# Patient Record
Sex: Female | Born: 1995 | Race: Black or African American | Hispanic: No | Marital: Single | Smoking: Never smoker
Health system: Southern US, Community
[De-identification: ages and names within clinical notes are randomized; demographics above are authoritative.]

## PROBLEM LIST (undated history)

## (undated) DIAGNOSIS — O009 Unspecified ectopic pregnancy without intrauterine pregnancy: Secondary | ICD-10-CM

## (undated) HISTORY — PX: SALPINGECTOMY: SHX328

---

## 2016-09-02 ENCOUNTER — Encounter (HOSPITAL_BASED_OUTPATIENT_CLINIC_OR_DEPARTMENT_OTHER): Payer: Self-pay | Admitting: Emergency Medicine

## 2016-09-02 ENCOUNTER — Emergency Department (HOSPITAL_BASED_OUTPATIENT_CLINIC_OR_DEPARTMENT_OTHER)
Admission: EM | Admit: 2016-09-02 | Discharge: 2016-09-02 | Disposition: A | Discharge status: Home / Self Care | Payer: PRIVATE HEALTH INSURANCE | Attending: Emergency Medicine | Admitting: Emergency Medicine

## 2016-09-02 DIAGNOSIS — G44209 Tension-type headache, unspecified, not intractable: Secondary | ICD-10-CM | POA: Insufficient documentation

## 2016-09-02 DIAGNOSIS — J029 Acute pharyngitis, unspecified: Secondary | ICD-10-CM | POA: Diagnosis not present

## 2016-09-02 DIAGNOSIS — R0602 Shortness of breath: Secondary | ICD-10-CM | POA: Diagnosis present

## 2016-09-02 NOTE — ED Provider Notes (Signed)
[facility] Provider Note   CSN: [id number] Arrival date & time: 09/02/16  1059     History   Chief Complaint Chief Complaint  Patient presents with  . Shortness of Breath    HPI Jessica Riggs is a 21 y.o. female.  HPI Patient presents the emergency department complaining of headache over the past several days as well as scratchy and irritated throat.  She also reports that she occasionally has to take a "double breath" to catch her breath.  She has no shortness of breath with exertion.  No orthopnea.  No chest pain.  No weakness of her arms or legs.  No recent head injury.  No weight loss or change in her vision.  Patient's been eating and drinking normally.  No other complaints   No past medical history on file.  There are no active problems to display for this patient.   No past surgical history on file.  OB History    No data available       Home Medications    Prior to Admission medications   Not on File    Family History No family history on file.  Social History Social History  Substance Use Topics  . Smoking status: Never Smoker  . Smokeless tobacco: Never Used  . Alcohol use No     Allergies   Patient has no known allergies.   Review of Systems Review of Systems  All other systems reviewed and are negative.    Physical Exam Updated Vital Signs BP (!) 130/99   Pulse 78   Temp 98.7 F (37.1 C) (Oral)   Resp 18   Ht 5' 7.5" (1.715 m)   Wt 99.8 kg (220 lb)   LMP 05/03/2016   SpO2 100%   BMI 33.95 kg/m   Physical Exam  Constitutional: She is oriented to person, place, and time. She appears well-developed and well-nourished. No distress.  HENT:  Head: Normocephalic and atraumatic.  Eyes: EOM are normal.  Neck: Normal range of motion.  Cardiovascular: Normal rate, regular rhythm and normal heart sounds.   Pulmonary/Chest: Effort normal and breath sounds normal.  Abdominal: Soft. She exhibits no distension. There is no  tenderness.  Musculoskeletal: Normal range of motion.  Neurological: She is alert and oriented to person, place, and time.  Skin: Skin is warm and dry.  Psychiatric: She has a normal mood and affect. Judgment normal.  Nursing note and vitals reviewed.    ED Treatments / Results  Labs (all labs ordered are listed, but only abnormal results are displayed) Labs Reviewed - No data to display  EKG  EKG Interpretation None       Radiology No results found.  Procedures Procedures (including critical care time)  Medications Ordered in ED Medications - No data to display   Initial Impression / Assessment and Plan / ED Course  I have reviewed the triage vital signs and the nursing notes.  Pertinent labs & imaging results that were available during my care of the patient were reviewed by me and considered in my medical decision making (see chart for details).     Well-appearing.  Increased stressors at home.  Mild generalized headache.  No hypoxia.  No increased work of breathing when ambulating.  Discharge home in good condition.  Primary care follow-up.  No indication for treatment or intervention or additional testing while in the ER.  Final Clinical Impressions(s) / ED Diagnoses   Final diagnoses:  Acute non intractable tension-type  headache  Sore throat    New Prescriptions New Prescriptions   No medications on file     Azalia Bilis, MD 09/02/16 1143

## 2016-09-02 NOTE — ED Triage Notes (Signed)
Pt states she has been sob for a few day, pt a/o in triage talking full sentences, NAD noted. Throat hurts after swallowing a pill wrong

## 2016-09-02 NOTE — Progress Notes (Signed)
RT assessed pt.  No distress noted, sats 100% on RA.  No resp. Hx per pt.  RN notified.  Will continue to monitor.

## 2016-09-02 NOTE — ED Notes (Signed)
Pt left before receiving d/c instructions 

## 2019-02-23 ENCOUNTER — Emergency Department (HOSPITAL_BASED_OUTPATIENT_CLINIC_OR_DEPARTMENT_OTHER)
Admission: EM | Admit: 2019-02-23 | Discharge: 2019-02-23 | Disposition: A | Discharge status: Home / Self Care | Payer: PRIVATE HEALTH INSURANCE | Attending: Emergency Medicine | Admitting: Emergency Medicine

## 2019-02-23 ENCOUNTER — Encounter (HOSPITAL_BASED_OUTPATIENT_CLINIC_OR_DEPARTMENT_OTHER): Payer: Self-pay | Admitting: Emergency Medicine

## 2019-02-23 ENCOUNTER — Other Ambulatory Visit: Payer: Self-pay

## 2019-02-23 DIAGNOSIS — J029 Acute pharyngitis, unspecified: Secondary | ICD-10-CM

## 2019-02-23 LAB — GROUP A STREP BY PCR: Group A Strep by PCR: NOT DETECTED

## 2019-02-23 MED ORDER — PREDNISONE 50 MG PO TABS
60.0000 mg | ORAL_TABLET | Freq: Once | ORAL | Status: AC
  Administered 2019-02-23: 60 mg via ORAL
  Filled 2019-02-23: qty 1

## 2019-02-23 MED ORDER — HYDROCODONE-ACETAMINOPHEN 5-325 MG PO TABS: 1.0000 | ORAL_TABLET | Freq: Four times a day (QID) | ORAL | 0 refills | Status: AC | PRN

## 2019-02-23 MED ORDER — PREDNISONE 10 MG PO TABS: 40.0000 mg | ORAL_TABLET | Freq: Every day | ORAL | 0 refills | Status: DC

## 2019-02-23 NOTE — Discharge Instructions (Addendum)
Take the prednisone as directed starting tomorrow.  Get your first dose today.  Take pain medicine as needed.  In the meantime can take Tylenol for the pain but when she start taking the pain medicine do not take Tylenol with it.  Return for any new or worse symptoms.  Your rapid strep test today was negative for strep pharyngitis.  So this is most likely a viral sore throat.  As we discussed if it persist beyond 7 days as possible could be from mononucleosis which is a virus and after a week we cannot test you for that to see if it is positive.

## 2019-02-23 NOTE — ED Triage Notes (Signed)
"   I have been having trouble when I swallow x 2 days" Drinking po well, no n/v or fever

## 2019-02-23 NOTE — ED Provider Notes (Signed)
[facility] EMERGENCY DEPARTMENT Provider Note   CSN: [id number] Arrival date & time: 02/23/19  2595     History Chief Complaint  Patient presents with  . Sore Throat    Jessica Riggs is a 23 y.o. female.  Patient with sore throat pain for 2 days and trouble swallowing.  No nausea vomiting or fever no cough or congestion.  No sick exposures.  Patient has not had a history of strep pharyngitis in the past.        History reviewed. No pertinent past medical history.  There are no problems to display for this patient.   History reviewed. No pertinent surgical history.   OB History   No obstetric history on file.     No family history on file.  Social History   Tobacco Use  . Smoking status: Never Smoker  . Smokeless tobacco: Never Used  Substance Use Topics  . Alcohol use: No  . Drug use: No    Home Medications Prior to Admission medications   Medication Sig Start Date End Date Taking? Authorizing Provider  JUNEL FE 1/20 1-20 MG-MCG tablet Take 1 tablet by mouth daily. 12/30/18   [provider]    Allergies    Patient has no known allergies.  Review of Systems   Review of Systems  Constitutional: Negative for chills and fever.  HENT: Positive for sore throat and trouble swallowing. Negative for congestion, facial swelling, rhinorrhea and sinus pain.   Eyes: Negative for visual disturbance.  Respiratory: Negative for cough and shortness of breath.   Cardiovascular: Negative for chest pain and leg swelling.  Gastrointestinal: Negative for abdominal pain, diarrhea, nausea and vomiting.  Genitourinary: Negative for dysuria.  Musculoskeletal: Negative for back pain and neck pain.  Skin: Negative for rash.  Neurological: Negative for dizziness, light-headedness and headaches.  Hematological: Does not bruise/bleed easily.  Psychiatric/Behavioral: Negative for confusion.    Physical Exam Updated Vital Signs BP (!) 137/106 (BP Location:  Right Arm)   Pulse (!) 102   Temp 99.2 F (37.3 C) (Oral)   Resp 14   Ht 1.727 m (5\' 8" )   Wt 68 kg   LMP 02/08/2019   SpO2 99%   BMI 22.81 kg/m   Physical Exam Vitals and nursing note reviewed.  Constitutional:      General: She is not in acute distress.    Appearance: Normal appearance. She is well-developed.  HENT:     Head: Normocephalic and atraumatic.     Mouth/Throat:     Mouth: Mucous membranes are moist.     Pharynx: Oropharyngeal exudate and posterior oropharyngeal erythema present.     Comments: Exudate patches on both tonsils.  Uvula midline.  Enlargement of tonsils erythema. Eyes:     Extraocular Movements: Extraocular movements intact.     Conjunctiva/sclera: Conjunctivae normal.     Pupils: Pupils are equal, round, and reactive to light.  Cardiovascular:     Rate and Rhythm: Normal rate and regular rhythm.     Heart sounds: No murmur.  Pulmonary:     Effort: Pulmonary effort is normal. No respiratory distress.     Breath sounds: Normal breath sounds.  Abdominal:     Palpations: Abdomen is soft.     Tenderness: There is no abdominal tenderness.  Musculoskeletal:        General: No swelling. Normal range of motion.     Cervical back: Normal range of motion and neck supple. No rigidity.  Lymphadenopathy:  Cervical: No cervical adenopathy.  Skin:    General: Skin is warm and dry.  Neurological:     General: No focal deficit present.     Mental Status: She is alert and oriented to person, place, and time.     ED Results / Procedures / Treatments   Labs (all labs ordered are listed, but only abnormal results are displayed) Labs Reviewed  GROUP A STREP BY PCR    EKG None  Radiology No results found.  Procedures Procedures (including critical care time)  Medications Ordered in ED Medications - No data to display  ED Course  I have reviewed the triage vital signs and the nursing notes.  Pertinent labs & imaging results that were  available during my care of the patient were reviewed by me and considered in my medical decision making (see chart for details).    MDM Rules/Calculators/A&P                      Consistent with pharyngitis.  No other symptoms.  Will check rapid strep.  If positive will treat with antibiotics if negative will treat symptomatically with steroids and some pain control.  Patient nontoxic no acute distress   Final Clinical Impression(s) / ED Diagnoses Final diagnoses:  Acute pharyngitis, unspecified etiology    Rx / DC Orders ED Discharge Orders    None       Fredia Sorrow, MD 02/23/19 [id number]

## 2019-02-23 NOTE — ED Notes (Signed)
Given  popsicle

## 2019-10-10 ENCOUNTER — Encounter (HOSPITAL_BASED_OUTPATIENT_CLINIC_OR_DEPARTMENT_OTHER): Payer: Self-pay

## 2019-10-10 ENCOUNTER — Emergency Department (HOSPITAL_BASED_OUTPATIENT_CLINIC_OR_DEPARTMENT_OTHER): Payer: PRIVATE HEALTH INSURANCE

## 2019-10-10 ENCOUNTER — Other Ambulatory Visit: Payer: Self-pay

## 2019-10-10 ENCOUNTER — Emergency Department (HOSPITAL_BASED_OUTPATIENT_CLINIC_OR_DEPARTMENT_OTHER)
Admission: EM | Admit: 2019-10-10 | Discharge: 2019-10-10 | Disposition: A | Discharge status: Home / Self Care | Payer: PRIVATE HEALTH INSURANCE | Attending: Emergency Medicine | Admitting: Emergency Medicine

## 2019-10-10 DIAGNOSIS — R05 Cough: Secondary | ICD-10-CM | POA: Diagnosis present

## 2019-10-10 DIAGNOSIS — Z20822 Contact with and (suspected) exposure to covid-19: Secondary | ICD-10-CM | POA: Insufficient documentation

## 2019-10-10 DIAGNOSIS — J069 Acute upper respiratory infection, unspecified: Secondary | ICD-10-CM | POA: Diagnosis not present

## 2019-10-10 LAB — SARS CORONAVIRUS 2 BY RT PCR (HOSPITAL ORDER, PERFORMED IN ~~LOC~~ HOSPITAL LAB): SARS Coronavirus 2: NEGATIVE

## 2019-10-10 LAB — URINALYSIS, MICROSCOPIC (REFLEX)

## 2019-10-10 LAB — URINALYSIS, ROUTINE W REFLEX MICROSCOPIC
Bilirubin Urine: NEGATIVE
Glucose, UA: NEGATIVE mg/dL
Ketones, ur: NEGATIVE mg/dL
Leukocytes,Ua: NEGATIVE
Nitrite: NEGATIVE
Protein, ur: NEGATIVE mg/dL
Specific Gravity, Urine: 1.025 (ref 1.005–1.030)
pH: 6 (ref 5.0–8.0)

## 2019-10-10 LAB — PREGNANCY, URINE: Preg Test, Ur: NEGATIVE

## 2019-10-10 MED ORDER — AEROCHAMBER PLUS FLO-VU MEDIUM MISC
1.0000 | Freq: Once | Status: AC
  Administered 2019-10-10: 1
  Filled 2019-10-10: qty 1

## 2019-10-10 MED ORDER — PREDNISONE 10 MG (21) PO TBPK: ORAL_TABLET | ORAL | 0 refills | Status: AC

## 2019-10-10 MED ORDER — IBUPROFEN 600 MG PO TABS: 600.0000 mg | ORAL_TABLET | Freq: Four times a day (QID) | ORAL | 0 refills | Status: AC | PRN

## 2019-10-10 MED ORDER — ALBUTEROL SULFATE HFA 108 (90 BASE) MCG/ACT IN AERS
2.0000 | INHALATION_SPRAY | Freq: Once | RESPIRATORY_TRACT | Status: AC
  Administered 2019-10-10: 2 via RESPIRATORY_TRACT
  Filled 2019-10-10: qty 6.7

## 2019-10-10 NOTE — ED Triage Notes (Signed)
Pt c/o flu like sx x 4 days-NAD-steady gait 

## 2019-10-10 NOTE — ED Provider Notes (Signed)
[facility] EMERGENCY DEPARTMENT Provider Note   CSN: [id number] Arrival date & time: 10/10/19  1158     History Chief Complaint  Patient presents with  . Cough    Jessica Riggs is a 24 y.o. female.  Pt presents to the ED today with flu like symptoms for the past few days.  Pt has really not been feeling well for about 3 weeks.  She has not been vaccinated against Covid.  She was tested for Covid last about 3 weeks ago when sx started. No f/c.        History reviewed. No pertinent past medical history.  There are no problems to display for this patient.   History reviewed. No pertinent surgical history.   OB History   No obstetric history on file.     No family history on file.  Social History   Tobacco Use  . Smoking status: Never Smoker  . Smokeless tobacco: Never Used  Vaping Use  . Vaping Use: Never used  Substance Use Topics  . Alcohol use: No  . Drug use: No    Home Medications Prior to Admission medications   Medication Sig Start Date End Date Taking? Authorizing Provider  HYDROcodone-acetaminophen (NORCO/VICODIN) 5-325 MG tablet Take 1 tablet by mouth every 6 (six) hours as needed for moderate pain. 02/23/19   Vanetta Mulders, MD  ibuprofen (ADVIL) 600 MG tablet Take 1 tablet (600 mg total) by mouth every 6 (six) hours as needed. 10/10/19   Jacalyn Lefevre, MD  JUNEL FE 1/20 1-20 MG-MCG tablet Take 1 tablet by mouth daily. 12/30/18   [provider]  predniSONE (STERAPRED UNI-PAK 21 TAB) 10 MG (21) TBPK tablet Take 6 tabs for 2 days, then 5 for 2 days, then 4 for 2 days, then 3 for 2 days, 2 for 2 days, then 1 for 2 days 10/10/19   Jacalyn Lefevre, MD    Allergies    Patient has no known allergies.  Review of Systems   Review of Systems  Respiratory: Positive for cough and shortness of breath.   All other systems reviewed and are negative.   Physical Exam Updated Vital Signs BP (!) 139/92 (BP Location: Right Arm)   Pulse 90    Temp 98.7 F (37.1 C) (Oral)   Resp 18   Ht 5\' 8"  (1.727 m)   Wt 117.9 kg   SpO2 100%   BMI 39.53 kg/m   Physical Exam Vitals and nursing note reviewed.  Constitutional:      Appearance: Normal appearance.  HENT:     Head: Normocephalic and atraumatic.     Right Ear: External ear normal.     Left Ear: External ear normal.     Nose: Nose normal.     Mouth/Throat:     Mouth: Mucous membranes are moist.     Pharynx: Oropharynx is clear.  Eyes:     Extraocular Movements: Extraocular movements intact.     Conjunctiva/sclera: Conjunctivae normal.     Pupils: Pupils are equal, round, and reactive to light.  Cardiovascular:     Rate and Rhythm: Normal rate and regular rhythm.     Pulses: Normal pulses.     Heart sounds: Normal heart sounds.  Pulmonary:     Effort: Pulmonary effort is normal.     Breath sounds: Normal breath sounds.  Abdominal:     General: Abdomen is flat. Bowel sounds are normal.     Palpations: Abdomen is soft.  Musculoskeletal:  General: Normal range of motion.     Cervical back: Normal range of motion and neck supple.  Skin:    General: Skin is warm.     Capillary Refill: Capillary refill takes less than 2 seconds.  Neurological:     General: No focal deficit present.     Mental Status: She is alert and oriented to person, place, and time.  Psychiatric:        Mood and Affect: Mood normal.        Behavior: Behavior normal.        Thought Content: Thought content normal.        Judgment: Judgment normal.     ED Results / Procedures / Treatments   Labs (all labs ordered are listed, but only abnormal results are displayed) Labs Reviewed  URINALYSIS, ROUTINE W REFLEX MICROSCOPIC - Abnormal; Notable for the following components:      Result Value   Hgb urine dipstick TRACE (*)    All other components within normal limits  URINALYSIS, MICROSCOPIC (REFLEX) - Abnormal; Notable for the following components:   Bacteria, UA MANY (*)    All  other components within normal limits  SARS CORONAVIRUS 2 BY RT PCR (HOSPITAL ORDER, PERFORMED IN [facility])  PREGNANCY, URINE    EKG None  Radiology DG Chest Portable 1 View  Result Date: 10/10/2019 CLINICAL DATA:  Chest pain with cough and shortness of breath EXAM: PORTABLE CHEST 1 VIEW COMPARISON:  January 27, 2018 FINDINGS: Lungs are clear. Heart size and pulmonary vascularity are normal. No adenopathy. No pneumothorax. No bone lesions. IMPRESSION: No abnormality noted. Electronically Signed   By: Bretta Bang III M.D.   On: 10/10/2019 13:38    Procedures Procedures (including critical care time)  Medications Ordered in ED Medications  albuterol (VENTOLIN HFA) 108 (90 Base) MCG/ACT inhaler 2 puff (2 puffs Inhalation Given 10/10/19 1355)  AeroChamber Plus Flo-Vu Medium MISC 1 each (1 each Other Given 10/10/19 1356)    ED Course  I have reviewed the triage vital signs and the nursing notes.  Pertinent labs & imaging results that were available during my care of the patient were reviewed by me and considered in my medical decision making (see chart for details).    MDM Rules/Calculators/A&P                         Pt's Covid swab was negative.  CXR is clear.  Oxygen is normal.  Pt is given an albuterol inhaler and instructed to use it for cough.  She will be d/c with prednisone and ibuprofen.  Pt is stable for d/c.   Jessica Riggs was evaluated in Emergency Department on 10/10/2019 for the symptoms described in the history of present illness. She was evaluated in the context of the global COVID-19 pandemic, which necessitated consideration that the patient might be at risk for infection with the SARS-CoV-2 virus that causes COVID-19. Institutional protocols and algorithms that pertain to the evaluation of patients at risk for COVID-19 are in a state of rapid change based on information released by regulatory bodies including the CDC and federal and state  organizations. These policies and algorithms were followed during the patient's care in the ED. Final Clinical Impression(s) / ED Diagnoses Final diagnoses:  Viral URI with cough  COVID-19 virus not detected    Rx / DC Orders ED Discharge Orders         Ordered  ibuprofen (ADVIL) 600 MG tablet  Every 6 hours PRN     Discontinue  Reprint     10/10/19 1409    predniSONE (STERAPRED UNI-PAK 21 TAB) 10 MG (21) TBPK tablet     Discontinue  Reprint     10/10/19 1409           Jacalyn Lefevre, MD 10/10/19 1411

## 2019-10-10 NOTE — ED Notes (Signed)
Pulse ox while walking to room; 98-100%.  No acute respiratory distress.  Pt states she only feels like she has a hard time breathing when laying flat.

## 2019-11-14 ENCOUNTER — Encounter (HOSPITAL_BASED_OUTPATIENT_CLINIC_OR_DEPARTMENT_OTHER): Payer: Self-pay | Admitting: Emergency Medicine

## 2019-11-14 ENCOUNTER — Emergency Department (HOSPITAL_BASED_OUTPATIENT_CLINIC_OR_DEPARTMENT_OTHER): Payer: PRIVATE HEALTH INSURANCE

## 2019-11-14 ENCOUNTER — Other Ambulatory Visit: Payer: Self-pay

## 2019-11-14 ENCOUNTER — Emergency Department (HOSPITAL_BASED_OUTPATIENT_CLINIC_OR_DEPARTMENT_OTHER)
Admission: EM | Admit: 2019-11-14 | Discharge: 2019-11-14 | Disposition: A | Discharge status: Home / Self Care | Payer: PRIVATE HEALTH INSURANCE | Attending: Emergency Medicine | Admitting: Emergency Medicine

## 2019-11-14 DIAGNOSIS — R059 Cough, unspecified: Secondary | ICD-10-CM

## 2019-11-14 DIAGNOSIS — R05 Cough: Secondary | ICD-10-CM | POA: Insufficient documentation

## 2019-11-14 DIAGNOSIS — Z20822 Contact with and (suspected) exposure to covid-19: Secondary | ICD-10-CM | POA: Diagnosis not present

## 2019-11-14 DIAGNOSIS — Z79899 Other long term (current) drug therapy: Secondary | ICD-10-CM | POA: Insufficient documentation

## 2019-11-14 DIAGNOSIS — J40 Bronchitis, not specified as acute or chronic: Secondary | ICD-10-CM

## 2019-11-14 LAB — SARS CORONAVIRUS 2 BY RT PCR (HOSPITAL ORDER, PERFORMED IN ~~LOC~~ HOSPITAL LAB): SARS Coronavirus 2: NEGATIVE

## 2019-11-14 MED ORDER — GUAIFENESIN 100 MG/5ML PO SOLN
10.0000 mL | Freq: Once | ORAL | Status: AC
  Administered 2019-11-14: 200 mg via ORAL
  Filled 2019-11-14: qty 10

## 2019-11-14 MED ORDER — AZITHROMYCIN 250 MG PO TABS
500.0000 mg | ORAL_TABLET | Freq: Once | ORAL | Status: AC
  Administered 2019-11-14: 500 mg via ORAL
  Filled 2019-11-14: qty 2

## 2019-11-14 MED ORDER — AZITHROMYCIN 250 MG PO TABS: 250.0000 mg | ORAL_TABLET | Freq: Every day | ORAL | 0 refills | Status: AC

## 2019-11-14 NOTE — ED Triage Notes (Signed)
Pt here with cough x 1 week. Has Rx for tessalon pearls with no relief. OTC does not work. Body aches. Was tested for COVID and it was negative here.

## 2019-11-14 NOTE — Discharge Instructions (Addendum)
It was our pleasure to provide your ER care today - we hope that you feel better.  Take antibiotic as prescribed.   Try mucinex or robitussin as need for cough.   Follow up with primary care doctor in 1-2 weeks if symptoms fail to improve/resolve.  For your health and safety, and that of your family and friends, please get covid vaccine as soon as able.   Return to ER if worse, new symptoms, increased trouble breathing, or other concern.

## 2019-11-14 NOTE — ED Provider Notes (Signed)
[facility] EMERGENCY DEPARTMENT Provider Note   CSN: [id number] Arrival date & time: 11/14/19  1824     History Chief Complaint  Patient presents with  . Cough  . Generalized Body Aches    Jessica Riggs is a 24 y.o. female.  Patient c/o productive cough in the past several days, with occasional clear to yellow phlegm. Symptoms acute onset, moderate, persistent. Denies chest pain or discomfort. No sob. No sore throat or runny nose. No fever. No known covid exposure, and is not vaccinated. States in ED prior, given prednisone, but cough persists. Denies hx  Asthma. Non smoker.   The history is provided by the patient.  Cough Associated symptoms: no chest pain, no chills, no fever, no headaches, no rash, no shortness of breath and no sore throat        History reviewed. No pertinent past medical history.  There are no problems to display for this patient.   History reviewed. No pertinent surgical history.   OB History   No obstetric history on file.     History reviewed. No pertinent family history.  Social History   Tobacco Use  . Smoking status: Never Smoker  . Smokeless tobacco: Never Used  Vaping Use  . Vaping Use: Never used  Substance Use Topics  . Alcohol use: No  . Drug use: No    Home Medications Prior to Admission medications   Medication Sig Start Date End Date Taking? Authorizing Provider  HYDROcodone-acetaminophen (NORCO/VICODIN) 5-325 MG tablet Take 1 tablet by mouth every 6 (six) hours as needed for moderate pain. 02/23/19   Vanetta Mulders, MD  ibuprofen (ADVIL) 600 MG tablet Take 1 tablet (600 mg total) by mouth every 6 (six) hours as needed. 10/10/19   Jacalyn Lefevre, MD  JUNEL FE 1/20 1-20 MG-MCG tablet Take 1 tablet by mouth daily. 12/30/18   [provider]  predniSONE (STERAPRED UNI-PAK 21 TAB) 10 MG (21) TBPK tablet Take 6 tabs for 2 days, then 5 for 2 days, then 4 for 2 days, then 3 for 2 days, 2 for 2 days, then 1 for  2 days 10/10/19   Jacalyn Lefevre, MD    Allergies    Patient has no known allergies.  Review of Systems   Review of Systems  Constitutional: Negative for chills and fever.  HENT: Negative for sore throat.   Eyes: Negative for redness.  Respiratory: Positive for cough. Negative for shortness of breath.   Cardiovascular: Negative for chest pain and leg swelling.  Gastrointestinal: Negative for abdominal pain and vomiting.  Genitourinary: Negative for flank pain.  Musculoskeletal: Negative for back pain and neck pain.  Skin: Negative for rash.  Neurological: Negative for headaches.  Hematological: Does not bruise/bleed easily.  Psychiatric/Behavioral: Negative for confusion.    Physical Exam Updated Vital Signs BP (!) 147/108   Pulse 86   Temp 98.9 F (37.2 C) (Oral)   Resp 18   SpO2 99%   Physical Exam Vitals and nursing note reviewed.  Constitutional:      Appearance: Normal appearance. She is well-developed.  HENT:     Head: Atraumatic.     Nose: Nose normal. No congestion or rhinorrhea.     Mouth/Throat:     Mouth: Mucous membranes are moist.     Pharynx: No oropharyngeal exudate or posterior oropharyngeal erythema.  Eyes:     General: No scleral icterus.    Conjunctiva/sclera: Conjunctivae normal.  Neck:     Trachea: No tracheal deviation.  Cardiovascular:     Rate and Rhythm: Normal rate and regular rhythm.     Pulses: Normal pulses.     Heart sounds: Normal heart sounds. No murmur heard.  No friction rub. No gallop.   Pulmonary:     Effort: Pulmonary effort is normal. No respiratory distress.     Breath sounds: Normal breath sounds.     Comments: Coughing, upper resp congestion.  Abdominal:     General: Bowel sounds are normal. There is no distension.     Palpations: Abdomen is soft.     Tenderness: There is no abdominal tenderness. There is no guarding.  Genitourinary:    Comments: No cva tenderness.  Musculoskeletal:        General: No swelling or  tenderness.     Cervical back: Normal range of motion and neck supple. No rigidity. No muscular tenderness.     Right lower leg: No edema.     Left lower leg: No edema.  Skin:    General: Skin is warm and dry.     Findings: No rash.  Neurological:     Mental Status: She is alert.     Comments: Alert, speech normal.   Psychiatric:        Mood and Affect: Mood normal.     ED Results / Procedures / Treatments   Labs (all labs ordered are listed, but only abnormal results are displayed) Labs Reviewed  SARS CORONAVIRUS 2 BY RT PCR (HOSPITAL ORDER, PERFORMED IN [facility])    EKG None  Radiology DG Chest 2 View  Result Date: 11/14/2019 CLINICAL DATA:  Cough, COVID negative EXAM: CHEST - 2 VIEW COMPARISON:  Chest x-ray 10/10/2019 FINDINGS: The heart size and mediastinal contours are within normal limits. No focal consolidation. No pulmonary edema. No pleural effusion. No pneumothorax. No acute osseous abnormality. IMPRESSION: No active cardiopulmonary disease. Electronically Signed   By: Tish Frederickson M.D.   On: 11/14/2019 21:02    Procedures Procedures (including critical care time)  Medications Ordered in ED Medications  azithromycin (ZITHROMAX) tablet 500 mg (500 mg Oral Given 11/14/19 2107)  guaiFENesin (ROBITUSSIN) 100 MG/5ML solution 200 mg (200 mg Oral Given 11/14/19 2108)    ED Course  I have reviewed the triage vital signs and the nursing notes.  Pertinent labs & imaging results that were available during my care of the patient were reviewed by me and considered in my medical decision making (see chart for details).    MDM Rules/Calculators/A&P                          Labs and cxr.   Reviewed nursing notes and prior charts for additional history.   Covid swab sent - pending.  Jessica Riggs was evaluated in Emergency Department on 11/14/2019 for the symptoms described in the history of present illness. She was evaluated in the context of the global  COVID-19 pandemic, which necessitated consideration that the patient might be at risk for infection with the SARS-CoV-2 virus that causes COVID-19. Institutional protocols and algorithms that pertain to the evaluation of patients at risk for COVID-19 are in a state of rapid change based on information released by regulatory bodies including the CDC and federal and state organizations. These policies and algorithms were followed during the patient's care in the ED.  Labs reviewed/interpreted by me - covid negative.   CXR reviewed/interpreted by me - no pna.   Pt  Is breathing  comfortably, and appears stable for d/c.      Final Clinical Impression(s) / ED Diagnoses Final diagnoses:  None    Rx / DC Orders ED Discharge Orders    None       Cathren Laine, MD 11/15/19 1707

## 2020-03-07 ENCOUNTER — Encounter (HOSPITAL_BASED_OUTPATIENT_CLINIC_OR_DEPARTMENT_OTHER): Payer: Self-pay | Admitting: *Deleted

## 2020-03-07 ENCOUNTER — Other Ambulatory Visit: Payer: Self-pay

## 2020-03-07 ENCOUNTER — Emergency Department (HOSPITAL_BASED_OUTPATIENT_CLINIC_OR_DEPARTMENT_OTHER)
Admission: EM | Admit: 2020-03-07 | Discharge: 2020-03-07 | Disposition: A | Discharge status: Home / Self Care | Payer: PRIVATE HEALTH INSURANCE | Attending: Emergency Medicine | Admitting: Emergency Medicine

## 2020-03-07 ENCOUNTER — Emergency Department (HOSPITAL_BASED_OUTPATIENT_CLINIC_OR_DEPARTMENT_OTHER): Payer: PRIVATE HEALTH INSURANCE

## 2020-03-07 DIAGNOSIS — R059 Cough, unspecified: Secondary | ICD-10-CM | POA: Diagnosis present

## 2020-03-07 DIAGNOSIS — U071 COVID-19: Secondary | ICD-10-CM | POA: Insufficient documentation

## 2020-03-07 DIAGNOSIS — R000 Tachycardia, unspecified: Secondary | ICD-10-CM | POA: Diagnosis not present

## 2020-03-07 LAB — COMPREHENSIVE METABOLIC PANEL
ALT: 21 U/L (ref 0–44)
AST: 29 U/L (ref 15–41)
Albumin: 4 g/dL (ref 3.5–5.0)
Alkaline Phosphatase: 43 U/L (ref 38–126)
Anion gap: 13 (ref 5–15)
BUN: 9 mg/dL (ref 6–20)
CO2: 24 mmol/L (ref 22–32)
Calcium: 8.9 mg/dL (ref 8.9–10.3)
Chloride: 98 mmol/L (ref 98–111)
Creatinine, Ser: 0.74 mg/dL (ref 0.44–1.00)
GFR, Estimated: >60 mL/min (ref >60)
Glucose, Bld: 107 mg/dL — ABNORMAL HIGH (ref 70–99)
Potassium: 3.4 mmol/L — ABNORMAL LOW (ref 3.5–5.1)
Sodium: 135 mmol/L (ref 135–145)
Total Bilirubin: 0.5 mg/dL (ref 0.3–1.2)
Total Protein: 7.8 g/dL (ref 6.5–8.1)

## 2020-03-07 LAB — CBC WITH DIFFERENTIAL/PLATELET
Abs Immature Granulocytes: 0.06 10*3/uL (ref 0.00–0.07)
Basophils Absolute: 0 10*3/uL (ref 0.0–0.1)
Basophils Relative: 1 %
Eosinophils Absolute: 0 10*3/uL (ref 0.0–0.5)
Eosinophils Relative: 1 %
HCT: 41.7 % (ref 36.0–46.0)
Hemoglobin: 14.3 g/dL (ref 12.0–15.0)
Immature Granulocytes: 1 %
Lymphocytes Relative: 55 %
Lymphs Abs: 3.8 10*3/uL (ref 0.7–4.0)
MCH: 25.4 pg — ABNORMAL LOW (ref 26.0–34.0)
MCHC: 34.3 g/dL (ref 30.0–36.0)
MCV: 74.2 fL — ABNORMAL LOW (ref 80.0–100.0)
Monocytes Absolute: 0.7 10*3/uL (ref 0.1–1.0)
Monocytes Relative: 10 %
Neutro Abs: 2.2 10*3/uL (ref 1.7–7.7)
Neutrophils Relative %: 32 %
Platelets: 367 10*3/uL (ref 150–400)
RBC: 5.62 MIL/uL — ABNORMAL HIGH (ref 3.87–5.11)
RDW: 13.5 % (ref 11.5–15.5)
WBC: 6.9 10*3/uL (ref 4.0–10.5)
nRBC: 0 % (ref 0.0–0.2)

## 2020-03-07 LAB — D-DIMER, QUANTITATIVE: D-Dimer, Quant: 0.43 ug/mL-FEU (ref 0.00–0.50)

## 2020-03-07 MED ORDER — IBUPROFEN 400 MG PO TABS
400.0000 mg | ORAL_TABLET | Freq: Once | ORAL | Status: AC
  Administered 2020-03-07: 400 mg via ORAL
  Filled 2020-03-07: qty 1

## 2020-03-07 MED ORDER — BENZONATATE 100 MG PO CAPS: 100.0000 mg | ORAL_CAPSULE | Freq: Three times a day (TID) | ORAL | 0 refills | Status: DC | PRN

## 2020-03-07 MED ORDER — AZITHROMYCIN 250 MG PO TABS: 250.0000 mg | ORAL_TABLET | Freq: Every day | ORAL | 0 refills | Status: AC

## 2020-03-07 NOTE — ED Triage Notes (Signed)
Covid Positive a week ago. Here today with cough.

## 2020-03-07 NOTE — ED Provider Notes (Signed)
[facility] Provider Note   CSN: [id number] Arrival date & time: 03/07/20  1741     History Chief Complaint  Patient presents with  . Covid Positive    Jessica Riggs is a 25 y.o. female.  The history is provided by the patient.   Jessica Riggs is a 25 y.o. female who presents to the Emergency Department complaining of cough, covid positive.  She became ill a week ago Wednesday with chills, body aches and sore throat.  She tested positive for covid 19 last Thursday.  She presents today for evaluation due to worsening cough.  She initially had a mild cough but over the last few days her cough has become significantly worse.  Cough is productive of mucous.  Denies fever today.  She has chest pain secondary to cough.  No leg swelling/pain.  Has no known medical problems.  Takes OCP.  Does not smoke.  No hx/o DVT/PE.    Taking robitussin and nyquil for cough with no significant improvement in cough. Has not been vaccinated for covid 19.        History reviewed. No pertinent past medical history.  There are no problems to display for this patient.   History reviewed. No pertinent surgical history.   OB History   No obstetric history on file.     No family history on file.  Social History   Tobacco Use  . Smoking status: Never Smoker  . Smokeless tobacco: Never Used  Vaping Use  . Vaping Use: Never used  Substance Use Topics  . Alcohol use: No  . Drug use: No    Home Medications Prior to Admission medications   Medication Sig Start Date End Date Taking? Authorizing Provider  azithromycin (ZITHROMAX) 250 MG tablet Take 1 tablet (250 mg total) by mouth daily. Take first 2 tablets together, then 1 every day until finished. 03/07/20  Yes Quintella Reichert, MD  benzonatate (TESSALON) 100 MG capsule Take 1 capsule (100 mg total) by mouth 3 (three) times daily as needed for cough. 03/07/20  Yes Quintella Reichert, MD  ibuprofen (ADVIL) 600 MG tablet Take 1  tablet (600 mg total) by mouth every 6 (six) hours as needed. 10/10/19  Yes Isla Pence, MD  JUNEL FE 1/20 1-20 MG-MCG tablet Take 1 tablet by mouth daily. 12/30/18  Yes [provider]  HYDROcodone-acetaminophen (NORCO/VICODIN) 5-325 MG tablet Take 1 tablet by mouth every 6 (six) hours as needed for moderate pain. 02/23/19   Fredia Sorrow, MD  predniSONE (STERAPRED UNI-PAK 21 TAB) 10 MG (21) TBPK tablet Take 6 tabs for 2 days, then 5 for 2 days, then 4 for 2 days, then 3 for 2 days, 2 for 2 days, then 1 for 2 days 10/10/19   Isla Pence, MD    Allergies    Patient has no known allergies.  Review of Systems   Review of Systems  All other systems reviewed and are negative.   Physical Exam Updated Vital Signs BP 117/82 (BP Location: Right Arm)   Pulse (!) 102   Temp 98.8 F (37.1 C)   Resp 18   Ht 5\' 8"  (1.727 m)   Wt 113.1 kg   SpO2 100%   BMI 37.92 kg/m   Physical Exam Vitals and nursing note reviewed.  Constitutional:      Appearance: She is well-developed and well-nourished.  HENT:     Head: Normocephalic and atraumatic.  Cardiovascular:     Rate and Rhythm: Regular rhythm.  Tachycardia present.     Heart sounds: No murmur heard.   Pulmonary:     Effort: Pulmonary effort is normal. No respiratory distress.     Breath sounds: Normal breath sounds.  Abdominal:     Palpations: Abdomen is soft.     Tenderness: There is no abdominal tenderness. There is no guarding or rebound.  Musculoskeletal:        General: No swelling, tenderness or edema.  Skin:    General: Skin is warm and dry.  Neurological:     Mental Status: She is alert and oriented to person, place, and time.  Psychiatric:        Mood and Affect: Mood and affect normal.        Behavior: Behavior normal.     ED Results / Procedures / Treatments   Labs (all labs ordered are listed, but only abnormal results are displayed) Labs Reviewed  CBC WITH DIFFERENTIAL/PLATELET - Abnormal;  Notable for the following components:      Result Value   RBC 5.62 (*)    MCV 74.2 (*)    MCH 25.4 (*)    All other components within normal limits  COMPREHENSIVE METABOLIC PANEL - Abnormal; Notable for the following components:   Potassium 3.4 (*)    Glucose, Bld 107 (*)    All other components within normal limits  D-DIMER, QUANTITATIVE (NOT AT [facility])    EKG None  Radiology DG Chest Portable 1 View  Result Date: 03/07/2020 CLINICAL DATA:  25 year old female with positive COVID-19 and cough. EXAM: PORTABLE CHEST 1 VIEW COMPARISON:  Chest radiograph dated 11/14/2019. FINDINGS: Faint left mid to lower lung field densities, likely atypical infiltrate. Clinical correlation is recommended. No focal consolidation, pleural effusion or pneumothorax. The cardiac silhouette is within normal limits. No acute osseous pathology. IMPRESSION: Probable atypical infiltrate in the left mid to lower lung field. Electronically Signed   By: Elgie Collard M.D.   On: 03/07/2020 18:42    Procedures Procedures (including critical care time)  Medications Ordered in ED Medications  ibuprofen (ADVIL) tablet 400 mg (400 mg Oral Given 03/07/20 1757)    ED Course  I have reviewed the triage vital signs and the nursing notes.  Pertinent labs & imaging results that were available during my care of the patient were reviewed by me and considered in my medical decision making (see chart for details).    MDM Rules/Calculators/A&P                         Patient recently tested positive for COVID-19 just over a week ago, comes in today for worsening cough and chest discomfort with coughing. She was tachycardic on ED presentation, improved with rest. She has no respiratory distress, good air movement bilaterally with clear lungs sounds. Chest x-ray with possible left lower lobe infiltrate, unclear if this is a bacterial superinfection versus related to her COVID-19 infection. Presentation is not consistent with PE,  she is on ACP but D dimer is negative, otherwise she is low risk. Discussed with patient home care for cough in setting of COVID, possible bacterial pneumonia. Discussed outpatient follow-up as well as return precautions.  Jessica Riggs was evaluated in Emergency Department on 03/07/2020 for the symptoms described in the history of present illness. She was evaluated in the context of the global COVID-19 pandemic, which necessitated consideration that the patient might be at risk for infection with the SARS-CoV-2 virus that causes COVID-19. Institutional protocols  and algorithms that pertain to the evaluation of patients at risk for COVID-19 are in a state of rapid change based on information released by regulatory bodies including the CDC and federal and state organizations. These policies and algorithms were followed during the patient's care in the ED.   Final Clinical Impression(s) / ED Diagnoses Final diagnoses:  COVID-19 virus infection    Rx / DC Orders ED Discharge Orders         Ordered    azithromycin (ZITHROMAX) 250 MG tablet  Daily        03/07/20 2250    benzonatate (TESSALON) 100 MG capsule  3 times daily PRN        03/07/20 2250           Tilden Fossa, MD 03/07/20 2305

## 2020-12-16 IMAGING — DX DG CHEST 2V
2 series · 2 of 2 positions shown · non-contrast
Comparison: Chest x-ray 10/10/2019

CLINICAL DATA: Cough, COVID negative

EXAM:
CHEST - 2 VIEW

[chest pa]
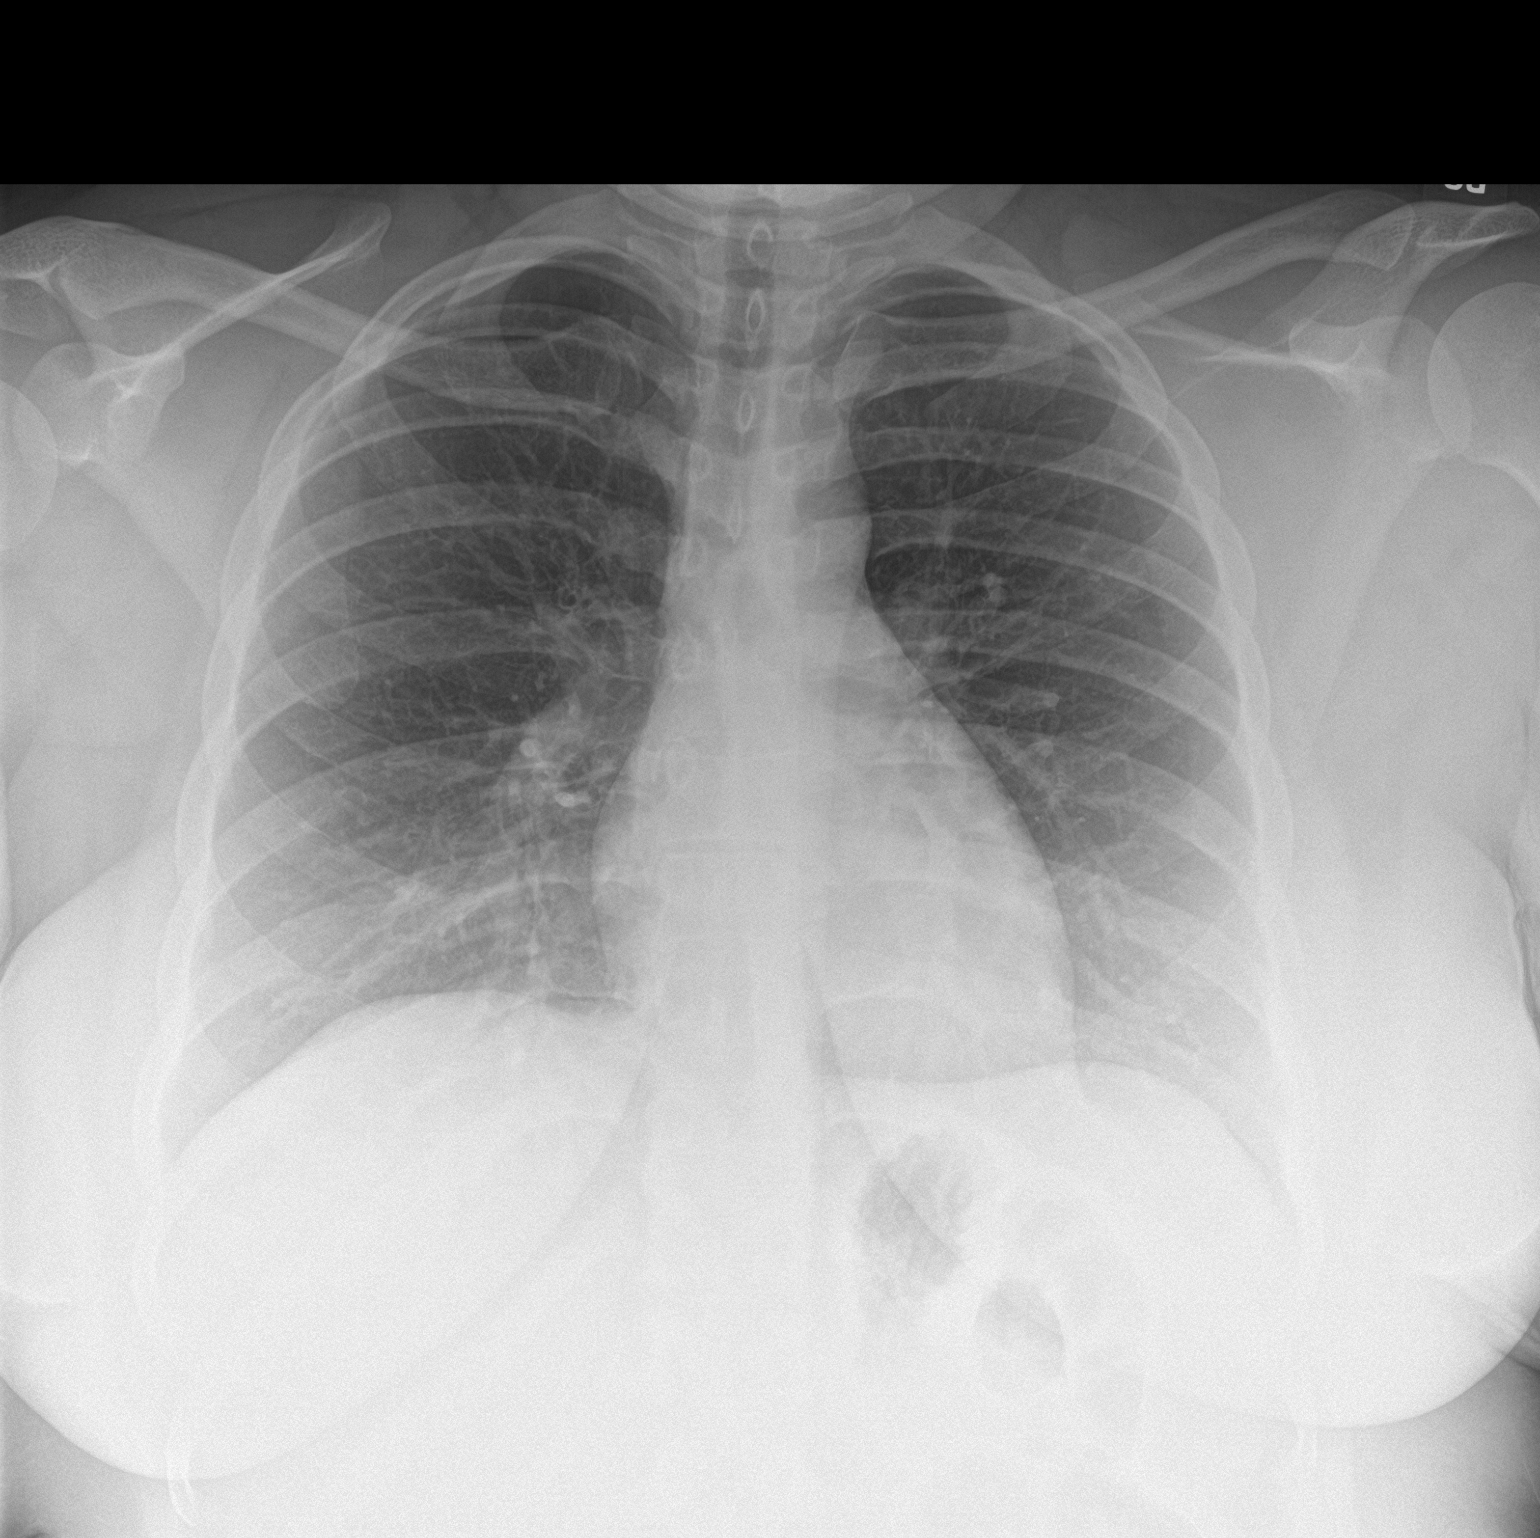

[chest lat]
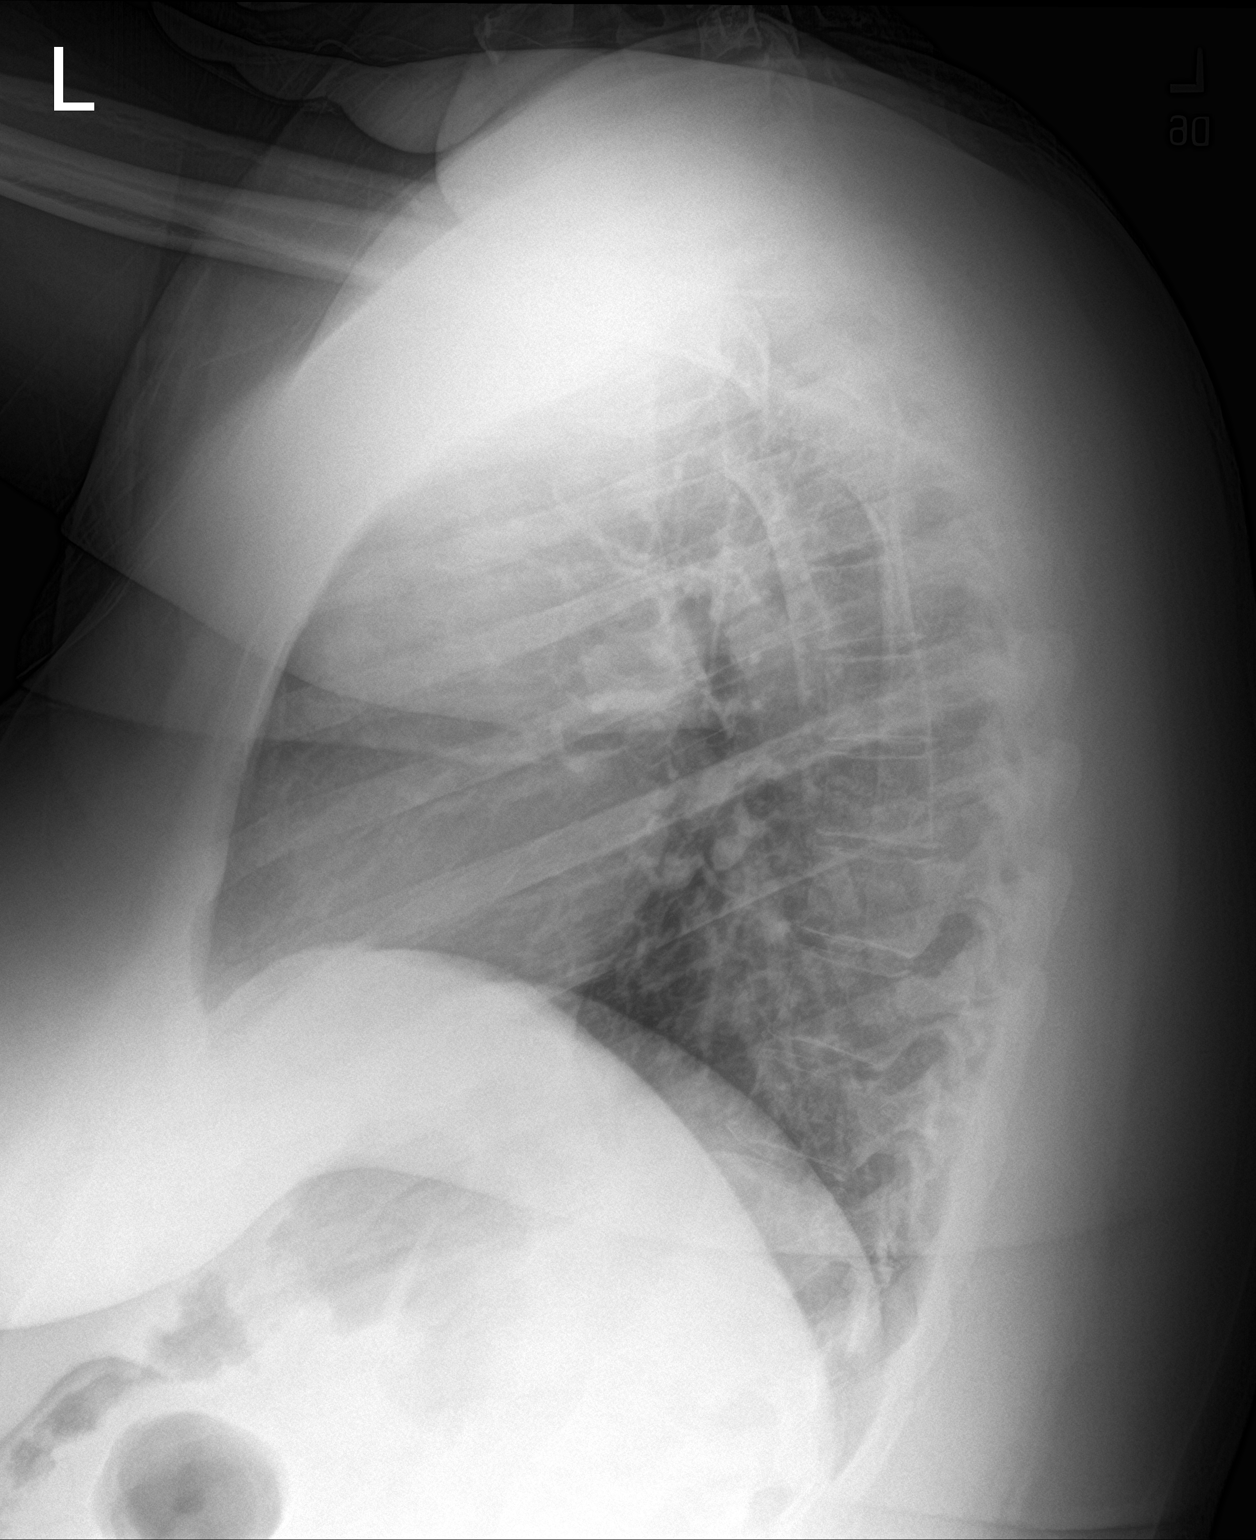

[2 of 2 positions shown; findings below may reference images not displayed]

FINDINGS: The heart size and mediastinal contours are within normal limits.

No focal consolidation. No pulmonary edema. No pleural effusion. No
pneumothorax.

No acute osseous abnormality.
IMPRESSION: No active cardiopulmonary disease.

## 2020-12-23 ENCOUNTER — Other Ambulatory Visit: Payer: Self-pay

## 2020-12-23 ENCOUNTER — Encounter (HOSPITAL_COMMUNITY): Payer: Self-pay

## 2020-12-23 ENCOUNTER — Ambulatory Visit (HOSPITAL_COMMUNITY)
Admission: EM | Admit: 2020-12-23 | Discharge: 2020-12-23 | Disposition: A | Discharge status: Home / Self Care | Payer: No Typology Code available for payment source | Attending: Emergency Medicine | Admitting: Emergency Medicine

## 2020-12-23 DIAGNOSIS — R059 Cough, unspecified: Secondary | ICD-10-CM | POA: Diagnosis not present

## 2020-12-23 DIAGNOSIS — Z20822 Contact with and (suspected) exposure to covid-19: Secondary | ICD-10-CM | POA: Insufficient documentation

## 2020-12-23 DIAGNOSIS — Z793 Long term (current) use of hormonal contraceptives: Secondary | ICD-10-CM | POA: Diagnosis not present

## 2020-12-23 DIAGNOSIS — R0789 Other chest pain: Secondary | ICD-10-CM | POA: Diagnosis not present

## 2020-12-23 DIAGNOSIS — J029 Acute pharyngitis, unspecified: Secondary | ICD-10-CM | POA: Diagnosis not present

## 2020-12-23 DIAGNOSIS — B349 Viral infection, unspecified: Secondary | ICD-10-CM | POA: Diagnosis not present

## 2020-12-23 LAB — POC INFLUENZA A AND B ANTIGEN (URGENT CARE ONLY)
INFLUENZA A ANTIGEN, POC: NEGATIVE
INFLUENZA B ANTIGEN, POC: NEGATIVE

## 2020-12-23 MED ORDER — PROMETHAZINE-DM 6.25-15 MG/5ML PO SYRP: 5.0000 mL | ORAL_SOLUTION | Freq: Four times a day (QID) | ORAL | 0 refills | Status: AC | PRN

## 2020-12-23 MED ORDER — BENZONATATE 100 MG PO CAPS: 100.0000 mg | ORAL_CAPSULE | Freq: Three times a day (TID) | ORAL | 0 refills | Status: AC | PRN

## 2020-12-23 MED ORDER — LIDOCAINE VISCOUS HCL 2 % MT SOLN: 15.0000 mL | OROMUCOSAL | 0 refills | Status: AC | PRN

## 2020-12-23 NOTE — Discharge Instructions (Addendum)
Your symptoms are most likely related to a virus meaning it will resolve over time, it may take up to 7 to 10 days before you truly start to feel like yourself  Flu test negative We will contact you if your COVID  or flu test is positive.  Please quarantine while you wait for the results.  If your test is negative you may resume normal activities.  If your test is positive please continue to quarantine for at least 5 days from your symptom onset or until you are without a fever for at least 24 hours after the medications.    May use tessalon pill every 8 hours as needed for cough  May use cough syrup every 6 hours as needed, be mindful this medication may make you drowsy, if this occurs just take at nighttime  May gargle then spit lidocaine solution to give temporary relief to throat  You can take Tylenol and/or Ibuprofen as needed for fever reduction and pain relief.   For cough: honey 1/2 to 1 teaspoon (you can dilute the honey in water or another fluid).  You can also use guaifenesin and dextromethorphan for cough. You can use a humidifier for chest congestion and cough.  If you don't have a humidifier, you can sit in the bathroom with the hot shower running.      For sore throat: try warm salt water gargles, cepacol lozenges, throat spray, warm tea or water with lemon/honey, popsicles or ice, or OTC cold relief medicine for throat discomfort.   For congestion: take a daily anti-histamine like Zyrtec, Claritin, and a oral decongestant, such as pseudoephedrine.  You can also use Flonase 1-2 sprays in each nostril daily.   It is important to stay hydrated: drink plenty of fluids (water, gatorade/powerade/pedialyte, juices, or teas) to keep your throat moisturized and help further relieve irritation/discomfort.

## 2020-12-23 NOTE — ED Triage Notes (Signed)
Pt presents with a headache and cough X 2 days.

## 2020-12-24 LAB — SARS CORONAVIRUS 2 (TAT 6-24 HRS): SARS Coronavirus 2: NEGATIVE

## 2020-12-25 NOTE — ED Provider Notes (Addendum)
[facility]    CSN: [id number] Arrival date & time: 12/23/20  1439      History   Chief Complaint Chief Complaint  Patient presents with   Cough   Sore Throat    HPI Jessica Riggs is a 25 y.o. female.   Patient presents with nonproductive cough, chest soreness, and constant frontal headache, Tolerating food and liquids. has attempted use of over-the-counter medications with no relief.  Denies fever, chills, body aches, abdominal pain, vomiting, nasal congestion, rhinorrhea, sore throat, chest pain or tightness, shortness of breath, wheezing.  History reviewed. No pertinent past medical history.  There are no problems to display for this patient.   History reviewed. No pertinent surgical history.  OB History   No obstetric history on file.      Home Medications    Prior to Admission medications   Medication Sig Start Date End Date Taking? Authorizing Provider  lidocaine (XYLOCAINE) 2 % solution Use as directed 15 mLs in the mouth or throat every 4 (four) hours as needed for mouth pain. 12/23/20  Yes Taevyn Hausen R, NP  promethazine-dextromethorphan (PROMETHAZINE-DM) 6.25-15 MG/5ML syrup Take 5 mLs by mouth 4 (four) times daily as needed for cough. 12/23/20  Yes Boris Engelmann R, NP  azithromycin (ZITHROMAX) 250 MG tablet Take 1 tablet (250 mg total) by mouth daily. Take first 2 tablets together, then 1 every day until finished. 03/07/20   Tilden Fossa, MD  benzonatate (TESSALON) 100 MG capsule Take 1 capsule (100 mg total) by mouth 3 (three) times daily as needed for cough. 12/23/20   Valinda Hoar, NP  HYDROcodone-acetaminophen (NORCO/VICODIN) 5-325 MG tablet Take 1 tablet by mouth every 6 (six) hours as needed for moderate pain. 02/23/19   Vanetta Mulders, MD  ibuprofen (ADVIL) 600 MG tablet Take 1 tablet (600 mg total) by mouth every 6 (six) hours as needed. 10/10/19   Jacalyn Lefevre, MD  JUNEL FE 1/20 1-20 MG-MCG tablet Take 1 tablet by mouth daily.  12/30/18   [provider]  predniSONE (STERAPRED UNI-PAK 21 TAB) 10 MG (21) TBPK tablet Take 6 tabs for 2 days, then 5 for 2 days, then 4 for 2 days, then 3 for 2 days, 2 for 2 days, then 1 for 2 days 10/10/19   Jacalyn Lefevre, MD    Family History History reviewed. No pertinent family history.  Social History Social History   Tobacco Use   Smoking status: Never   Smokeless tobacco: Never  Vaping Use   Vaping Use: Never used  Substance Use Topics   Alcohol use: No   Drug use: No     Allergies   Patient has no known allergies.   Review of Systems Review of Systems Defer to HPI    Physical Exam Triage Vital Signs ED Triage Vitals  Enc Vitals Group     BP 12/23/20 1557 (!) 164/101     Pulse Rate 12/23/20 1557 (!) 113     Resp 12/23/20 1557 20     Temp 12/23/20 1557 98.6 F (37 C)     Temp Source 12/23/20 1557 Oral     SpO2 12/23/20 1557 99 %     Weight --      Height --      Head Circumference --      Peak Flow --      Pain Score 12/23/20 1556 7     Pain Loc --      Pain Edu? --  Excl. in GC? --    No data found.  Updated Vital Signs BP (!) 164/101 (BP Location: Left Arm)   Pulse (!) 113   Temp 98.6 F (37 C) (Oral)   Resp 20   LMP 06/03/2020   SpO2 99%   Visual Acuity Right Eye Distance:   Left Eye Distance:   Bilateral Distance:    Right Eye Near:   Left Eye Near:    Bilateral Near:     Physical Exam Constitutional:      Appearance: Normal appearance. She is normal weight.  HENT:     Head: Normocephalic.     Right Ear: Tympanic membrane, ear canal and external ear normal.     Left Ear: Tympanic membrane, ear canal and external ear normal.     Nose: Congestion present. No rhinorrhea.     Mouth/Throat:     Mouth: Mucous membranes are moist.     Pharynx: Posterior oropharyngeal erythema present.  Eyes:     Extraocular Movements: Extraocular movements intact.  Cardiovascular:     Rate and Rhythm: Tachycardia present.      Heart sounds: Normal heart sounds.  Pulmonary:     Effort: Pulmonary effort is normal.     Breath sounds: Normal breath sounds.  Musculoskeletal:     Cervical back: Normal range of motion and neck supple.  Skin:    General: Skin is warm and dry.  Neurological:     Mental Status: She is alert and oriented to person, place, and time. Mental status is at baseline.  Psychiatric:        Mood and Affect: Mood normal.        Behavior: Behavior normal.     UC Treatments / Results  Labs (all labs ordered are listed, but only abnormal results are displayed) Labs Reviewed  SARS CORONAVIRUS 2 (TAT 6-24 HRS)  POC INFLUENZA A AND B ANTIGEN (URGENT CARE ONLY)    EKG   Radiology No results found.  Procedures Procedures (including critical care time)  Medications Ordered in UC Medications - No data to display  Initial Impression / Assessment and Plan / UC Course  I have reviewed the triage vital signs and the nursing notes.  Pertinent labs & imaging results that were available during my care of the patient were reviewed by me and considered in my medical decision making (see chart for details).  Viral illness  1.  Flu test negative 2.  COVID test pending 3.  Tessalon 100 mg tid prn  4. Lidocaine viscous 2% 15 mL every 4 hours prn 5. Promethazine DM 5 mL every 4 hours prn  Final Clinical Impressions(s) / UC Diagnoses   Final diagnoses:  Viral illness     Discharge Instructions      Your symptoms are most likely related to a virus meaning it will resolve over time, it may take up to 7 to 10 days before you truly start to feel like yourself  Flu test negative We will contact you if your COVID  or flu test is positive.  Please quarantine while you wait for the results.  If your test is negative you may resume normal activities.  If your test is positive please continue to quarantine for at least 5 days from your symptom onset or until you are without a fever for at least 24  hours after the medications.    May use tessalon pill every 8 hours as needed for cough  May use cough syrup every  6 hours as needed, be mindful this medication may make you drowsy, if this occurs just take at nighttime  May gargle then spit lidocaine solution to give temporary relief to throat  You can take Tylenol and/or Ibuprofen as needed for fever reduction and pain relief.   For cough: honey 1/2 to 1 teaspoon (you can dilute the honey in water or another fluid).  You can also use guaifenesin and dextromethorphan for cough. You can use a humidifier for chest congestion and cough.  If you don't have a humidifier, you can sit in the bathroom with the hot shower running.      For sore throat: try warm salt water gargles, cepacol lozenges, throat spray, warm tea or water with lemon/honey, popsicles or ice, or OTC cold relief medicine for throat discomfort.   For congestion: take a daily anti-histamine like Zyrtec, Claritin, and a oral decongestant, such as pseudoephedrine.  You can also use Flonase 1-2 sprays in each nostril daily.   It is important to stay hydrated: drink plenty of fluids (water, gatorade/powerade/pedialyte, juices, or teas) to keep your throat moisturized and help further relieve irritation/discomfort.     ED Prescriptions     Medication Sig Dispense Auth. Provider   benzonatate (TESSALON) 100 MG capsule Take 1 capsule (100 mg total) by mouth 3 (three) times daily as needed for cough. 15 capsule Joziah Dollins R, NP   promethazine-dextromethorphan (PROMETHAZINE-DM) 6.25-15 MG/5ML syrup Take 5 mLs by mouth 4 (four) times daily as needed for cough. 118 mL Marieke Lubke R, NP   lidocaine (XYLOCAINE) 2 % solution Use as directed 15 mLs in the mouth or throat every 4 (four) hours as needed for mouth pain. 100 mL Valinda Hoar, NP      PDMP not reviewed this encounter.   Valinda Hoar, NP 12/25/20 0830    Valinda Hoar, NP 12/25/20 [phone]

## 2022-02-21 ENCOUNTER — Other Ambulatory Visit: Payer: Self-pay

## 2022-02-21 ENCOUNTER — Emergency Department (HOSPITAL_BASED_OUTPATIENT_CLINIC_OR_DEPARTMENT_OTHER)
Admission: EM | Admit: 2022-02-21 | Discharge: 2022-02-21 | Discharge status: Against Medical Advice | Payer: No Typology Code available for payment source | Attending: Emergency Medicine | Admitting: Emergency Medicine

## 2022-02-21 ENCOUNTER — Encounter (HOSPITAL_BASED_OUTPATIENT_CLINIC_OR_DEPARTMENT_OTHER): Payer: Self-pay | Admitting: Emergency Medicine

## 2022-02-21 DIAGNOSIS — R0981 Nasal congestion: Secondary | ICD-10-CM | POA: Insufficient documentation

## 2022-02-21 DIAGNOSIS — H9202 Otalgia, left ear: Secondary | ICD-10-CM | POA: Diagnosis not present

## 2022-02-21 DIAGNOSIS — Z5321 Procedure and treatment not carried out due to patient leaving prior to being seen by health care provider: Secondary | ICD-10-CM | POA: Insufficient documentation

## 2022-02-21 DIAGNOSIS — R059 Cough, unspecified: Secondary | ICD-10-CM | POA: Diagnosis not present

## 2022-02-21 HISTORY — DX: Unspecified ectopic pregnancy without intrauterine pregnancy: O00.90

## 2022-02-21 MED ORDER — IBUPROFEN 800 MG PO TABS
800.0000 mg | ORAL_TABLET | Freq: Once | ORAL | Status: AC
  Administered 2022-02-21: 800 mg via ORAL
  Filled 2022-02-21: qty 1

## 2022-02-21 NOTE — ED Notes (Signed)
Called in [facility], no answer

## 2022-02-21 NOTE — ED Triage Notes (Signed)
Nasal congestion, cough x 1 week. Pt states she was seen for same, tested for covid and flu which were both negative. Pt states she has been taking OTC meds without relief. L ear pain x 3 days. Denies fever.

## 2022-02-21 NOTE — ED Notes (Signed)
Called in [facility], x 1, no answer 

## 2023-02-06 ENCOUNTER — Other Ambulatory Visit: Payer: Self-pay

## 2023-02-06 ENCOUNTER — Encounter (HOSPITAL_BASED_OUTPATIENT_CLINIC_OR_DEPARTMENT_OTHER): Payer: Self-pay | Admitting: Emergency Medicine

## 2023-02-06 ENCOUNTER — Emergency Department (HOSPITAL_BASED_OUTPATIENT_CLINIC_OR_DEPARTMENT_OTHER)
Admission: EM | Admit: 2023-02-06 | Discharge: 2023-02-06 | Disposition: A | Discharge status: Home / Self Care | Payer: PRIVATE HEALTH INSURANCE | Attending: Emergency Medicine | Admitting: Emergency Medicine

## 2023-02-06 DIAGNOSIS — U071 COVID-19: Secondary | ICD-10-CM | POA: Insufficient documentation

## 2023-02-06 DIAGNOSIS — Z3A22 22 weeks gestation of pregnancy: Secondary | ICD-10-CM | POA: Insufficient documentation

## 2023-02-06 DIAGNOSIS — O26891 Other specified pregnancy related conditions, first trimester: Secondary | ICD-10-CM | POA: Diagnosis present

## 2023-02-06 DIAGNOSIS — O98512 Other viral diseases complicating pregnancy, second trimester: Secondary | ICD-10-CM | POA: Insufficient documentation

## 2023-02-06 DIAGNOSIS — R000 Tachycardia, unspecified: Secondary | ICD-10-CM | POA: Diagnosis not present

## 2023-02-06 LAB — RESP PANEL BY RT-PCR (RSV, FLU A&B, COVID)  RVPGX2
Influenza A by PCR: NEGATIVE
Influenza B by PCR: NEGATIVE
Resp Syncytial Virus by PCR: NEGATIVE
SARS Coronavirus 2 by RT PCR: POSITIVE — AB

## 2023-02-06 LAB — GROUP A STREP BY PCR: Group A Strep by PCR: NOT DETECTED

## 2023-02-06 MED ORDER — ACETAMINOPHEN 325 MG PO TABS
650.0000 mg | ORAL_TABLET | Freq: Once | ORAL | Status: AC
  Administered 2023-02-06: 650 mg via ORAL
  Filled 2023-02-06: qty 2

## 2023-02-06 NOTE — ED Notes (Signed)
Pt is [redacted]wks pregnant.

## 2023-02-06 NOTE — ED Provider Notes (Signed)
[facility] Provider Note   CSN: [id number] Arrival date & time: 02/06/23  1738    History  Chief Complaint  Patient presents with   Sore Throat    Jessica Riggs is a 27 y.o. female here for evaluation of cough and sore throat.  She is [redacted] weeks pregnant.  Symptoms started 2 days ago.  Robitussin at home, no Tylenol today.  Mild congestion and rhinorrhea.  Cough is nonproductive.  No chest pain, shortness of breath, abdominal pain.  No vaginal bleeding, fluid leakage.  States she does feel "flutters."  No dysuria or hematuria.  Follows with OB/GYN, takes prenatal.  She took a COVID test at home which was positive  HPI     Home Medications Prior to Admission medications   Medication Sig Start Date End Date Taking? Authorizing Provider  azithromycin (ZITHROMAX) 250 MG tablet Take 1 tablet (250 mg total) by mouth daily. Take first 2 tablets together, then 1 every day until finished. 03/07/20   Tilden Fossa, MD  benzonatate (TESSALON) 100 MG capsule Take 1 capsule (100 mg total) by mouth 3 (three) times daily as needed for cough. 12/23/20   Valinda Hoar, NP  HYDROcodone-acetaminophen (NORCO/VICODIN) 5-325 MG tablet Take 1 tablet by mouth every 6 (six) hours as needed for moderate pain. 02/23/19   Vanetta Mulders, MD  ibuprofen (ADVIL) 600 MG tablet Take 1 tablet (600 mg total) by mouth every 6 (six) hours as needed. 10/10/19   Jacalyn Lefevre, MD  JUNEL FE 1/20 1-20 MG-MCG tablet Take 1 tablet by mouth daily. 12/30/18   [provider]  lidocaine (XYLOCAINE) 2 % solution Use as directed 15 mLs in the mouth or throat every 4 (four) hours as needed for mouth pain. 12/23/20   White, Elita Boone, NP  predniSONE (STERAPRED UNI-PAK 21 TAB) 10 MG (21) TBPK tablet Take 6 tabs for 2 days, then 5 for 2 days, then 4 for 2 days, then 3 for 2 days, 2 for 2 days, then 1 for 2 days 10/10/19   Jacalyn Lefevre, MD  promethazine-dextromethorphan  (PROMETHAZINE-DM) 6.25-15 MG/5ML syrup Take 5 mLs by mouth 4 (four) times daily as needed for cough. 12/23/20   Valinda Hoar, NP      Allergies    Patient has no known allergies.    Review of Systems   Review of Systems  Constitutional:  Positive for chills and fatigue.  HENT:  Positive for congestion, rhinorrhea and sore throat.   Respiratory:  Positive for cough.   Cardiovascular: Negative.   Gastrointestinal: Negative.   Genitourinary: Negative.   Musculoskeletal: Negative.   Skin: Negative.   Neurological: Negative.   All other systems reviewed and are negative.   Physical Exam Updated Vital Signs BP 130/81   Pulse (!) 105   Temp 99.8 F (37.7 C) (Oral)   Resp 18   Ht 5\' 8"  (1.727 m)   Wt 124.7 kg   LMP 12/19/2021 Comment: irregular cycles  SpO2 98%   BMI 41.81 kg/m  Physical Exam Vitals and nursing note reviewed.  Constitutional:      General: She is not in acute distress.    Appearance: She is well-developed. She is not ill-appearing, toxic-appearing or diaphoretic.  HENT:     Head: Normocephalic and atraumatic.     Right Ear: Tympanic membrane and ear canal normal.     Left Ear: Tympanic membrane and ear canal normal.     Nose: Rhinorrhea present. No  congestion.     Mouth/Throat:     Mouth: Mucous membranes are moist.     Tonsils: No tonsillar exudate or tonsillar abscesses. 0 on the right. 0 on the left.     Comments: Posterior pharynx clear, uvula midline, no pooling of secretions, tonsils 0 bilaterally, no exudate, erythema Eyes:     Pupils: Pupils are equal, round, and reactive to light.  Neck:     Trachea: Phonation normal.     Comments: No neck stiffness or neck rigidity, no meningismus Cardiovascular:     Rate and Rhythm: Tachycardia present.     Pulses:          Radial pulses are 2+ on the right side and 2+ on the left side.     Heart sounds: Normal heart sounds.     Comments: HR 101 Pulmonary:     Effort: Pulmonary effort is normal. No  respiratory distress.     Breath sounds: Normal air entry.     Comments: Clear bilaterally, speaks in full sentences without difficulty Abdominal:     General: Bowel sounds are normal. There is no distension.     Palpations: Abdomen is soft.     Tenderness: There is no abdominal tenderness. There is no right CVA tenderness, left CVA tenderness, guarding or rebound.     Comments: Soft, gravid to umbilicus  Musculoskeletal:        General: Normal range of motion.     Cervical back: Full passive range of motion without pain, normal range of motion and neck supple.     Comments: Moves all 4 extremities, compartment soft, nontender calves bilaterally  Skin:    General: Skin is warm and dry.  Neurological:     General: No focal deficit present.     Mental Status: She is alert.  Psychiatric:        Mood and Affect: Mood normal.     ED Results / Procedures / Treatments   Labs (all labs ordered are listed, but only abnormal results are displayed) Labs Reviewed  RESP PANEL BY RT-PCR (RSV, FLU A&B, COVID)  [id number] - Abnormal; Notable for the following components:      Result Value   SARS Coronavirus 2 by RT PCR POSITIVE (*)    All other components within normal limits  GROUP A STREP BY PCR    EKG None  Radiology No results found.  Procedures Ultrasound ED OB Pelvic  Date/Time: 02/06/2023 8:46 PM  Performed by: Ralph Leyden A, PA-C Authorized by: Linwood Dibbles, PA-C   Procedure details:    Indications comment:  Pregnant   Assess:  Intrauterine pregnancy   Technique:  Transabdominal obstetric (HCG+) exam   Images: archived   Study Limitations: body habitus Uterine findings:    Fetal heart rate: identified     Estimated gestational age: 45 weeks Left ovary findings:    Left ovary:  Not visualized    Right ovary findings:     Right ovary:  Not visualized    Comments:     HR 140's with positive movement     Medications Ordered in ED Medications   acetaminophen (TYLENOL) tablet 650 mg (650 mg Oral Given 02/06/23 1816)    ED Course/ Medical Decision Making/ A&P   G2 P0 approximately [redacted] weeks pregnant here for evaluation of URI symptoms.  2 days of cough, congestion, rhinorrhea and sore throat.  Doing Robitussin at home.  She has no chest pain, shortness of breath, does not appear  grossly fluid overloaded I low suspicion for PE or DVT.  Her posterior pharynx is clear.  She is tolerating her secretions.  I low suspicion for strep pharyngitis, PTA, RPA.  Her abdomen is soft, gravid.  Bedside ultrasound shows fetus with heart rate 140's, with active movement on ultrasound she denies any abdominal pain, fluid leakage, vaginal bleeding.  She has no UTI symptoms  Labs personally viewed and interpreted:  Strep negative COVID-positive  I discussed COVID-positive with patient.  Discussed OTC medication she can take in the setting of pregnancy.  I encouraged her to let her OB/GYN know she is COVID-positive.  If she develops any abdominal pain, fluid leakage, lack of fetal movement I encouraged her to seek treatment at [facility] at [facility].  Will have her follow-up outpatient, return for any worsening symptoms  The patient has been appropriately medically screened and/or stabilized in the ED. I have low suspicion for any other emergent medical condition which would require further screening, evaluation or treatment in the ED or require inpatient management.  Patient is hemodynamically stable and in no acute distress.  Patient able to ambulate in department prior to ED.  Evaluation does not show acute pathology that would require ongoing or additional emergent interventions while in the emergency department or further inpatient treatment.  I have discussed the diagnosis with the patient and answered all questions.  Pain is been managed while in the emergency department and patient has no further complaints prior to discharge.  Patient is comfortable with  plan discussed in room and is stable for discharge at this time.  I have discussed strict return precautions for returning to the emergency department.  Patient was encouraged to follow-up with PCP/specialist refer to at discharge.                                  Medical Decision Making Amount and/or Complexity of Data Reviewed Independent Historian: spouse External Data Reviewed: labs, radiology and notes. Labs: ordered. Decision-making details documented in ED Course. Radiology: ordered and independent interpretation performed. Decision-making details documented in ED Course.  Risk OTC drugs. Prescription drug management. Decision regarding hospitalization. Diagnosis or treatment significantly limited by social determinants of health.          Final Clinical Impression(s) / ED Diagnoses Final diagnoses:  COVID  [redacted] weeks gestation of pregnancy    Rx / DC Orders ED Discharge Orders     None         Kumari Sculley A, PA-C 02/06/23 2048    Tegeler, Canary Brim, MD 02/06/23 2056

## 2023-02-06 NOTE — ED Triage Notes (Signed)
Pt with cough and sore throat; +home covid test; [redacted] wks pregnant

## 2023-02-06 NOTE — Discharge Instructions (Signed)
Tylenol as needed for pain and fever  Rest, drink plenty of fluids  Make sure to let your OB/GYN know you were seen here  Return for new or worsening symptoms

## 2023-02-06 NOTE — ED Notes (Signed)
Covid swab obtained
